# Patient Record
Sex: Female | Born: 1985 | Race: White | Hispanic: No | Marital: Married | Smoking: Never smoker
Health system: Southern US, Community
[De-identification: ages and names within clinical notes are randomized; demographics above are authoritative.]

## PROBLEM LIST (undated history)

## (undated) DIAGNOSIS — F419 Anxiety disorder, unspecified: Secondary | ICD-10-CM

## (undated) DIAGNOSIS — G43909 Migraine, unspecified, not intractable, without status migrainosus: Secondary | ICD-10-CM

## (undated) DIAGNOSIS — IMO0002 Reserved for concepts with insufficient information to code with codable children: Secondary | ICD-10-CM

## (undated) HISTORY — DX: Anxiety disorder, unspecified: F41.9

## (undated) HISTORY — DX: Reserved for concepts with insufficient information to code with codable children: IMO0002

## (undated) HISTORY — DX: Migraine, unspecified, not intractable, without status migrainosus: G43.909

---

## 2008-11-10 HISTORY — PX: DILATION AND CURETTAGE OF UTERUS: SHX78

## 2018-08-25 ENCOUNTER — Encounter: Payer: Self-pay | Admitting: Medical

## 2018-08-25 ENCOUNTER — Ambulatory Visit (INDEPENDENT_AMBULATORY_CARE_PROVIDER_SITE_OTHER): Payer: 59 | Admitting: Medical

## 2018-08-25 VITALS — BP 112/83 | HR 63 | Temp 98.5°F | Resp 16 | Ht 66.0 in | Wt 202.0 lb

## 2018-08-25 DIAGNOSIS — F439 Reaction to severe stress, unspecified: Secondary | ICD-10-CM

## 2018-08-25 DIAGNOSIS — G4700 Insomnia, unspecified: Secondary | ICD-10-CM | POA: Diagnosis not present

## 2018-08-25 DIAGNOSIS — E669 Obesity, unspecified: Secondary | ICD-10-CM

## 2018-08-25 DIAGNOSIS — F419 Anxiety disorder, unspecified: Secondary | ICD-10-CM

## 2018-08-25 DIAGNOSIS — N62 Hypertrophy of breast: Secondary | ICD-10-CM | POA: Diagnosis not present

## 2018-08-25 MED ORDER — TRAZODONE HCL 50 MG PO TABS: 25.0000 mg | ORAL_TABLET | Freq: Every evening | ORAL | 0 refills | Status: DC | PRN

## 2018-08-25 MED ORDER — PHENTERMINE HCL 15 MG PO CAPS: 15.0000 mg | ORAL_CAPSULE | ORAL | 0 refills | Status: DC

## 2018-08-25 NOTE — Patient Instructions (Signed)
For your history of obesity, large breast and desire for breast reduction, I am prescribing you phentermine to take daily.  Continue with diet and exercise.  Hopefully you will be able to lose a 10% body weight that your insurance requires.  Potential side effects discussed today.  Advised to avoid caffeine use.  For history of insomnia, I am prescribing trazodone.  Recommend that you start to use this for couple of days before you use the phentermine.  For stress and anxiety, you might benefit from trazodone.  If anxiety worsens please let me know and might consider other medications.  Recommend follow-up in 1 month or as needed.  You could go ahead and schedule a complete physical exam and come in fasting that morning.
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
## 2018-09-10 HISTORY — PX: REDUCTION MAMMAPLASTY: SUR839

## 2018-09-16 ENCOUNTER — Other Ambulatory Visit: Payer: Self-pay | Admitting: Medical

## 2018-09-28 ENCOUNTER — Encounter: Payer: Managed Care, Other (non HMO) | Admitting: Medical

## 2019-05-06 ENCOUNTER — Other Ambulatory Visit: Payer: Self-pay | Admitting: Obstetrics and Gynecology

## 2019-05-06 DIAGNOSIS — N63 Unspecified lump in unspecified breast: Secondary | ICD-10-CM

## 2019-05-12 ENCOUNTER — Ambulatory Visit
Admission: RE | Admit: 2019-05-12 | Discharge: 2019-05-12 | Disposition: A | Discharge status: Home / Self Care | Payer: 59 | Source: Ambulatory Visit | Attending: Obstetrics and Gynecology | Admitting: Obstetrics and Gynecology

## 2019-05-12 ENCOUNTER — Other Ambulatory Visit: Payer: Self-pay

## 2019-05-12 DIAGNOSIS — N63 Unspecified lump in unspecified breast: Secondary | ICD-10-CM

## 2019-11-24 ENCOUNTER — Ambulatory Visit (INDEPENDENT_AMBULATORY_CARE_PROVIDER_SITE_OTHER): Payer: Managed Care, Other (non HMO) | Admitting: Medical

## 2019-11-24 ENCOUNTER — Other Ambulatory Visit (HOSPITAL_COMMUNITY)
Admission: RE | Admit: 2019-11-24 | Discharge: 2019-11-24 | Disposition: A | Discharge status: Home / Self Care | Payer: Managed Care, Other (non HMO) | Source: Ambulatory Visit | Attending: Medical | Admitting: Medical

## 2019-11-24 ENCOUNTER — Encounter: Payer: Self-pay | Admitting: Medical

## 2019-11-24 ENCOUNTER — Other Ambulatory Visit: Payer: Self-pay

## 2019-11-24 VITALS — BP 122/88 | HR 76 | Temp 96.9°F | Resp 18 | Ht 66.0 in | Wt 204.2 lb

## 2019-11-24 DIAGNOSIS — Z0000 Encounter for general adult medical examination without abnormal findings: Secondary | ICD-10-CM

## 2019-11-24 DIAGNOSIS — R8290 Unspecified abnormal findings in urine: Secondary | ICD-10-CM | POA: Diagnosis not present

## 2019-11-24 DIAGNOSIS — E669 Obesity, unspecified: Secondary | ICD-10-CM | POA: Diagnosis not present

## 2019-11-24 LAB — LIPID PANEL
Cholesterol: 212 mg/dL — ABNORMAL HIGH (ref 0–200)
HDL: 63.2 mg/dL (ref >39.00)
LDL Cholesterol: 122 mg/dL — ABNORMAL HIGH (ref 0–99)
NonHDL: 148.84
Total CHOL/HDL Ratio: 3
Triglycerides: 133 mg/dL (ref 0.0–149.0)
VLDL: 26.6 mg/dL (ref 0.0–40.0)

## 2019-11-24 LAB — COMPREHENSIVE METABOLIC PANEL
ALT: 10 U/L (ref 0–35)
AST: 15 U/L (ref 0–37)
Albumin: 4.3 g/dL (ref 3.5–5.2)
Alkaline Phosphatase: 79 U/L (ref 39–117)
BUN: 14 mg/dL (ref 6–23)
CO2: 27 mEq/L (ref 19–32)
Calcium: 9.2 mg/dL (ref 8.4–10.5)
Chloride: 104 mEq/L (ref 96–112)
Creatinine, Ser: 0.79 mg/dL (ref 0.40–1.20)
GFR: 82.62 mL/min (ref >60.00)
Glucose, Bld: 90 mg/dL (ref 70–99)
Potassium: 4.1 mEq/L (ref 3.5–5.1)
Sodium: 138 mEq/L (ref 135–145)
Total Bilirubin: 0.4 mg/dL (ref 0.2–1.2)
Total Protein: 7.6 g/dL (ref 6.0–8.3)

## 2019-11-24 LAB — POC URINALSYSI DIPSTICK (AUTOMATED)
Bilirubin, UA: NEGATIVE
Glucose, UA: NEGATIVE
Ketones, UA: NEGATIVE
Nitrite, UA: NEGATIVE
Protein, UA: NEGATIVE
Spec Grav, UA: >=1.03 — AB (ref 1.010–1.025)
Urobilinogen, UA: 0.2 E.U./dL
pH, UA: 5.5 (ref 5.0–8.0)

## 2019-11-24 LAB — CBC WITH DIFFERENTIAL/PLATELET
Basophils Absolute: 0.1 10*3/uL (ref 0.0–0.1)
Basophils Relative: 0.7 % (ref 0.0–3.0)
Eosinophils Absolute: 0 10*3/uL (ref 0.0–0.7)
Eosinophils Relative: 0.6 % (ref 0.0–5.0)
HCT: 39 % (ref 36.0–46.0)
Hemoglobin: 13 g/dL (ref 12.0–15.0)
Lymphocytes Relative: 33 % (ref 12.0–46.0)
Lymphs Abs: 2.7 10*3/uL (ref 0.7–4.0)
MCHC: 33.2 g/dL (ref 30.0–36.0)
MCV: 86.2 fl (ref 78.0–100.0)
Monocytes Absolute: 0.5 10*3/uL (ref 0.1–1.0)
Monocytes Relative: 6.7 % (ref 3.0–12.0)
Neutro Abs: 4.8 10*3/uL (ref 1.4–7.7)
Neutrophils Relative %: 59 % (ref 43.0–77.0)
Platelets: 254 10*3/uL (ref 150.0–400.0)
RBC: 4.53 Mil/uL (ref 3.87–5.11)
RDW: 12.4 % (ref 11.5–15.5)
WBC: 8.1 10*3/uL (ref 4.0–10.5)

## 2019-11-24 MED ORDER — CIPROFLOXACIN HCL 250 MG PO TABS: 250.0000 mg | ORAL_TABLET | Freq: Two times a day (BID) | ORAL | 0 refills | Status: DC

## 2019-11-24 NOTE — Progress Notes (Signed)
Subjective:    Patient ID: Tina Stephens, female    DOB: [birth year], 34 y.o.   MRN: [id number]  HPI  She states she feel well today.  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 Has 2 children 38 yo and 1 yo. Rare caffeine beverage. She is married.    Pt states has bad odor to her urine for over a week.   Pt states wants to loose weight . In past she was up to 315 lb 16  years ago. In past got down to 165 lb. Pt orders every plate on line. Expresses frustration about efforts. WW in past did help but seems to indicate would not want to do that now. She is doubtful that insurance will cover weight loss MD evaluation but willing to investigate. Was going to go more in depth regarding her desired method but she expressed needed to leave/get thru with appointment in expidited manner. I was running behind by about 35 minutes today.   Nov 02, 2019.  Papsmear- recently done. Results pending.   Review of Systems  Constitutional: Negative for chills, fatigue and fever.  Respiratory: Negative for cough, chest tightness, shortness of breath and wheezing.   Cardiovascular: Negative for chest pain and palpitations.  Gastrointestinal: Negative for abdominal pain.  Genitourinary: Negative for difficulty urinating, enuresis, frequency, pelvic pain and urgency.       Odor to urine.  Musculoskeletal: Negative for back pain, neck pain and neck stiffness.  Skin: Negative for rash.  Neurological: Negative for dizziness, syncope, weakness, numbness and headaches.  Hematological: Negative for adenopathy. Does not bruise/bleed easily.  Psychiatric/Behavioral: Negative for behavioral problems, confusion and sleep disturbance. The patient is not nervous/anxious.     Past Medical History:  Diagnosis Date  . Anxiety   . Migraine      Social History   Socioeconomic History  . Marital status: Married    Spouse name: Not on file  . Number of children: Not on file  . Years  of education: Not on file  . Highest education level: Not on file  Occupational History  . Not on file  Tobacco Use  . Smoking status: Never Smoker  . Smokeless tobacco: Never Used  Substance and Sexual Activity  . Alcohol use: Never  . Drug use: Never  . Sexual activity: Yes  Other Topics Concern  . Not on file  Social History Narrative  . Not on file   Social Determinants of Health   Financial Resource Strain:   . Difficulty of Paying Living Expenses: Not on file  Food Insecurity:   . Worried About Charity fundraiser in the Last Year: Not on file  . Ran Out of Food in the Last Year: Not on file  Transportation Needs:   . Lack of Transportation (Medical): Not on file  . Lack of Transportation (Non-Medical): Not on file  Physical Activity:   . Days of Exercise per Week: Not on file  . Minutes of Exercise per Session: Not on file  Stress:   . Feeling of Stress : Not on file  Social Connections:   . Frequency of Communication with Friends and Family: Not on file  . Frequency of Social Gatherings with Friends and Family: Not on file  . Attends Religious Services: Not on file  . Active Member of Clubs or Organizations: Not on file  . Attends Archivist Meetings: Not on file  . Marital Status: Not on file  Intimate Partner Violence:   . Fear of Current or Ex-Partner: Not on file  . Emotionally Abused: Not on file  . Physically Abused: Not on file  . Sexually Abused: Not on file      Family History  Problem Relation Age of Onset  . Hypertension Mother     No Known Allergies  Current Outpatient Medications on File Prior to Visit  Medication Sig Dispense Refill  . linaclotide (LINZESS) 145 MCG CAPS capsule Take by mouth.    . Norgestimate-Ethinyl Estradiol Triphasic 0.18/0.215/0.25 MG-25 MCG tab Take by mouth.     No current facility-administered medications on file prior to visit.    BP 122/88 (BP Location: Right Arm, Patient Position: Sitting, Cuff  Size: Normal)   Pulse 76   Temp (!) 96.9 F (36.1 C) (Temporal)   Resp 18   Ht 5\' 6"  (1.676 m)   Wt 204 lb 3.2 oz (92.6 kg)   SpO2 98%   BMI 32.96 kg/m       Objective:   Physical Exam  General Mental Status- Alert. General Appearance- Not in acute distress.   Skin General: Color- Normal Color. Moisture- Normal Moisture.  Neck Carotid Arteries- Normal color. Moisture- Normal Moisture. No carotid bruits. No JVD.  Chest and Lung Exam Auscultation: Breath Sounds:-Normal.  Cardiovascular Auscultation:Rythm- Regular. Murmurs & Other Heart Sounds:Auscultation of the heart reveals- No Murmurs.  Abdomen Inspection:-Inspeection Normal. Palpation/Percussion:Note:No mass. Palpation and Percussion of the abdomen reveal- Non Tender, Non Distended + BS, no rebound or guarding.    Neurologic Cranial Nerve exam:- CN III-XII intact(No nystagmus), symmetric smile. Strength:- 5/5 equal and symmetric strength both upper and lower extremities.      Assessment & Plan:  For you wellness exam today I have ordered cbc, cmp, lipid panel, ua and urine culture.  Cipro 250 mg twice daily x 3 days. Possible uti. Will also get urine ancillary bv.  Recommend exercise and healthy diet.  We will let you know lab results as they come in.  Follow up date appointment will be determined after lab review.   Referral to weight management placed.(counseled on wt loss)  10-15  minutes spent counseling on plan for urine odor/possible uti and wt loss counseling. Referral to wt loss management placed.  , PA-C

## 2019-11-24 NOTE — Patient Instructions (Addendum)
For you wellness exam today I have ordered cbc, cmp, lipid panel, ua and urine culture.  Cipro 250 mg twice daily x 3 days. Possible uti. Will also get urine ancillary bv.  Recommend exercise and healthy diet.  We will let you know lab results as they come in.  Follow up date appointment will be determined after lab review.   Referral to weight management placed.(counseled on wt loss)   Preventive Care 54-36 Years Old, Female Preventive care refers to visits with your health care provider and lifestyle choices that can promote health and wellness. This includes:  A yearly physical exam. This may also be called an annual well check.  Regular dental visits and eye exams.  Immunizations.  Screening for certain conditions.  Healthy lifestyle choices, such as eating a healthy diet, getting regular exercise, not using drugs or products that contain nicotine and tobacco, and limiting alcohol use. What can I expect for my preventive care visit? Physical exam Your health care provider will check your:  Height and weight. This may be used to calculate body mass index (BMI), which tells if you are at a healthy weight.  Heart rate and blood pressure.  Skin for abnormal spots. Counseling Your health care provider may ask you questions about your:  Alcohol, tobacco, and drug use.  Emotional well-being.  Home and relationship well-being.  Sexual activity.  Eating habits.  Work and work Statistician.  Method of birth control.  Menstrual cycle.  Pregnancy history. What immunizations do I need?  Influenza (flu) vaccine  This is recommended every year. Tetanus, diphtheria, and pertussis (Tdap) vaccine  You may need a Td booster every 10 years. Varicella (chickenpox) vaccine  You may need this if you have not been vaccinated. Human papillomavirus (HPV) vaccine  If recommended by your health care provider, you may need three doses over 6 months. Measles, mumps, and  rubella (MMR) vaccine  You may need at least one dose of MMR. You may also need a second dose. Meningococcal conjugate (MenACWY) vaccine  One dose is recommended if you are age 2-21 years and a first-year college student living in a residence hall, or if you have one of several medical conditions. You may also need additional booster doses. Pneumococcal conjugate (PCV13) vaccine  You may need this if you have certain conditions and were not previously vaccinated. Pneumococcal polysaccharide (PPSV23) vaccine  You may need one or two doses if you smoke cigarettes or if you have certain conditions. Hepatitis A vaccine  You may need this if you have certain conditions or if you travel or work in places where you may be exposed to hepatitis A. Hepatitis B vaccine  You may need this if you have certain conditions or if you travel or work in places where you may be exposed to hepatitis B. Haemophilus influenzae type b (Hib) vaccine  You may need this if you have certain conditions. You may receive vaccines as individual doses or as more than one vaccine together in one shot (combination vaccines). Talk with your health care provider about the risks and benefits of combination vaccines. What tests do I need?  Blood tests  Lipid and cholesterol levels. These may be checked every 5 years starting at age 60.  Hepatitis C test.  Hepatitis B test. Screening  Diabetes screening. This is done by checking your blood sugar (glucose) after you have not eaten for a while (fasting).  Sexually transmitted disease (STD) testing.  BRCA-related cancer screening. This may be  done if you have a family history of breast, ovarian, tubal, or peritoneal cancers.  Pelvic exam and Pap test. This may be done every 3 years starting at age 45. Starting at age 35, this may be done every 5 years if you have a Pap test in combination with an HPV test. Talk with your health care provider about your test results,  treatment options, and if necessary, the need for more tests. Follow these instructions at home: Eating and drinking   Eat a diet that includes fresh fruits and vegetables, whole grains, lean protein, and low-fat dairy.  Take vitamin and mineral supplements as recommended by your health care provider.  Do not drink alcohol if: ? Your health care provider tells you not to drink. ? You are pregnant, may be pregnant, or are planning to become pregnant.  If you drink alcohol: ? Limit how much you have to 0-1 drink a day. ? Be aware of how much alcohol is in your drink. In the [place] one drink equals one 12 oz bottle of beer (355 mL), one 5 oz glass of wine (148 mL), or one 1 oz glass of hard liquor (44 mL). Lifestyle  Take daily care of your teeth and gums.  Stay active. Exercise for at least 30 minutes on 5 or more days each week.  Do not use any products that contain nicotine or tobacco, such as cigarettes, e-cigarettes, and chewing tobacco. If you need help quitting, ask your health care provider.  If you are sexually active, practice safe sex. Use a condom or other form of birth control (contraception) in order to prevent pregnancy and STIs (sexually transmitted infections). If you plan to become pregnant, see your health care provider for a preconception visit. What's next?  Visit your health care provider once a year for a well check visit.  Ask your health care provider how often you should have your eyes and teeth checked.  Stay up to date on all vaccines. This information is not intended to replace advice given to you by your health care provider. Make sure you discuss any questions you have with your health care provider. Document Revised: 07/08/2018 Document Reviewed: 07/08/2018 Elsevier Patient Education  2020 [employer].

## 2019-11-26 LAB — URINE CULTURE
MICRO NUMBER:: 10042290
SPECIMEN QUALITY:: ADEQUATE

## 2019-11-29 ENCOUNTER — Encounter: Payer: Self-pay | Admitting: Medical

## 2019-11-29 LAB — URINE CYTOLOGY ANCILLARY ONLY: Bacterial Vaginitis-Urine: NEGATIVE

## 2019-12-04 ENCOUNTER — Telehealth: Payer: Self-pay | Admitting: Medical

## 2019-12-04 MED ORDER — PHENTERMINE HCL 15 MG PO CAPS: 15.0000 mg | ORAL_CAPSULE | ORAL | 0 refills | Status: DC

## 2019-12-04 NOTE — Telephone Encounter (Signed)
I sent in prescription of phentermine to pt pharmacy. I believe was successful sending rx electronically. Will you follow up on that. Make sure it went thru.

## 2019-12-05 ENCOUNTER — Other Ambulatory Visit: Payer: Self-pay | Admitting: Medical

## 2019-12-05 NOTE — Telephone Encounter (Signed)
Called pharmacy Rx is at pharmacy ready for pickup. Left voicemail advising patient of Rx ready.

## 2019-12-06 ENCOUNTER — Other Ambulatory Visit: Payer: Self-pay | Admitting: Medical

## 2019-12-06 ENCOUNTER — Telehealth: Payer: Self-pay | Admitting: Medical

## 2019-12-06 NOTE — Telephone Encounter (Signed)
Take 1 tablet (250 mg total) by mouth 2 (two) times daily., Starting Thu 11/24/2019, Normal  Dispense: 6 tablet  Refills: 0 ordered     phentermine 15 MG capsule [[id number]]   Patient states that Rx wasn't sent to [facility] , or that she was unable to get Rx due to approval. She would like RX send   [facility] Dr Ginette Otto

## 2019-12-06 NOTE — Telephone Encounter (Signed)
Spoke to [facility] yesterday who informed medicine was ready for pick up yesterday. Notified patient today again.

## 2020-12-05 ENCOUNTER — Ambulatory Visit: Payer: Managed Care, Other (non HMO) | Admitting: Family Medicine

## 2020-12-11 LAB — HM PAP SMEAR: HM Pap smear: NORMAL
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
## 2021-08-12 ENCOUNTER — Encounter: Payer: Self-pay | Admitting: Medical

## 2021-08-29 ENCOUNTER — Ambulatory Visit (INDEPENDENT_AMBULATORY_CARE_PROVIDER_SITE_OTHER): Payer: Managed Care, Other (non HMO) | Admitting: Medical-Surgical

## 2021-08-29 ENCOUNTER — Encounter: Payer: Self-pay | Admitting: Medical-Surgical

## 2021-08-29 VITALS — BP 134/88 | HR 77 | Resp 20 | Ht 66.0 in | Wt 175.0 lb

## 2021-08-29 DIAGNOSIS — R8290 Unspecified abnormal findings in urine: Secondary | ICD-10-CM | POA: Diagnosis not present

## 2021-08-29 DIAGNOSIS — Z131 Encounter for screening for diabetes mellitus: Secondary | ICD-10-CM

## 2021-08-29 DIAGNOSIS — Z7689 Persons encountering health services in other specified circumstances: Secondary | ICD-10-CM

## 2021-08-29 DIAGNOSIS — Z23 Encounter for immunization: Secondary | ICD-10-CM

## 2021-08-29 DIAGNOSIS — Z0000 Encounter for general adult medical examination without abnormal findings: Secondary | ICD-10-CM

## 2021-08-29 DIAGNOSIS — F900 Attention-deficit hyperactivity disorder, predominantly inattentive type: Secondary | ICD-10-CM

## 2021-08-29 DIAGNOSIS — Z1329 Encounter for screening for other suspected endocrine disorder: Secondary | ICD-10-CM

## 2021-08-29 LAB — POCT URINALYSIS DIP (CLINITEK)
Bilirubin, UA: NEGATIVE
Glucose, UA: NEGATIVE mg/dL
Nitrite, UA: POSITIVE — AB
POC PROTEIN,UA: NEGATIVE
Spec Grav, UA: 1.025 (ref 1.010–1.025)
Urobilinogen, UA: 0.2 E.U./dL
pH, UA: 6 (ref 5.0–8.0)

## 2021-08-29 LAB — WET PREP FOR TRICH, YEAST, CLUE
MICRO NUMBER:: 12528918
Specimen Quality: ADEQUATE

## 2021-08-29 MED ORDER — AMPHETAMINE-DEXTROAMPHET ER 25 MG PO CP24: 25.0000 mg | ORAL_CAPSULE | ORAL | 0 refills | Status: DC

## 2021-08-29 MED ORDER — FLUCONAZOLE 150 MG PO TABS: 150.0000 mg | ORAL_TABLET | Freq: Once | ORAL | 0 refills | Status: AC

## 2021-08-29 MED ORDER — AMPHETAMINE-DEXTROAMPHET ER 25 MG PO CP24: 25.0000 mg | ORAL_CAPSULE | Freq: Every day | ORAL | 0 refills | Status: DC

## 2021-08-29 NOTE — Addendum Note (Signed)
Addended byChristen Butter on: 08/29/2021 12:17 PM   Modules accepted: Orders

## 2021-08-29 NOTE — Progress Notes (Signed)
New Patient Office Visit  Subjective:  Patient ID: Tina Stephens, female    DOB: September 30, 1986  Age: 35 y.o. MRN: [id number]  CC:  Chief Complaint  Patient presents with   Establish Care    HPI Jaydn Fincher presents to establish care.   Attention deficit- has been seeing a provider and a counselor with [facility]. Treated with Adderall XR 25mg  daily. Tolerates the medication well without side effects and feels that it helps her focus. Reports that [facility] is no longer able to prescribe controlled substances and advised her to either switch to Strattera or establish with a PCP for continuation of Adderall.   Had COVID in August of 2021. Since then her taste and smell have not returned to normal. Has also noted that her urine smells funny. Was evaluated by her OB/GYN and treated for both yeast and BV. The smell did not resolved. No other urinary symptoms. Notes that the smell does not change no matter how much water she drinks.   Past Medical History:  Diagnosis Date   Anxiety    Migraine    Ulcer 2004   diagnosed bleeding ulcer in 2004. No isses for a few years.    Past Surgical History:  Procedure Laterality Date   DILATION AND CURETTAGE OF UTERUS  2010   REDUCTION MAMMAPLASTY Bilateral 09/2018    Family History  Problem Relation Age of Onset   Hypertension Mother    Cancer Maternal Grandmother    Diabetes Paternal Grandmother    Cancer Paternal Aunt     Social History   Socioeconomic History   Marital status: Married    Spouse name: Not on file   Number of children: Not on file   Years of education: Not on file   Highest education level: Not on file  Occupational History   Not on file  Tobacco Use   Smoking status: Never   Smokeless tobacco: Never  Vaping Use   Vaping Use: Never used  Substance and Sexual Activity   Alcohol use: Never   Drug use: Never   Sexual activity: Yes    Birth control/protection: Pill  Other Topics Concern   Not on file  Social  History Narrative   Not on file   Social Determinants of Health   Financial Resource Strain: Not on file  Food Insecurity: Not on file  Transportation Needs: Not on file  Physical Activity: Not on file  Stress: Not on file  Social Connections: Not on file  Intimate Partner Violence: Not on file    ROS Review of Systems  Constitutional:  Negative for chills, fatigue, fever and unexpected weight change.  Respiratory:  Negative for cough, chest tightness, shortness of breath and wheezing.   Cardiovascular:  Negative for chest pain, palpitations and leg swelling.  Gastrointestinal:  Negative for abdominal pain, constipation, diarrhea, nausea and vomiting.  Endocrine: Positive for cold intolerance.  Genitourinary:        Abnormal urinary odor  Musculoskeletal:  Positive for back pain.  Neurological:  Positive for dizziness, light-headedness and numbness.  Hematological:  Bruises/bleeds easily.  Psychiatric/Behavioral:  Negative for dysphoric mood, self-injury, sleep disturbance and suicidal ideas. The patient is not nervous/anxious.    Objective:   Today's Vitals: BP 134/88 (BP Location: Left Arm, Patient Position: Sitting, Cuff Size: Normal)   Pulse 77   Resp 20   Ht 5\' 6"  (1.676 m)   Wt 175 lb (79.4 kg)   SpO2 99%   BMI 28.25 kg/m  Physical Exam Vitals reviewed.  Constitutional:      General: She is not in acute distress.    Appearance: Normal appearance. She is not ill-appearing.  HENT:     Head: Normocephalic and atraumatic.  Cardiovascular:     Rate and Rhythm: Normal rate and regular rhythm.     Pulses: Normal pulses.     Heart sounds: Normal heart sounds. No murmur heard.   No friction rub. No gallop.  Pulmonary:     Effort: Pulmonary effort is normal. No respiratory distress.     Breath sounds: Normal breath sounds. No wheezing.  Skin:    General: Skin is warm and dry.  Neurological:     Mental Status: She is alert and oriented to person, place, and time.   Psychiatric:        Mood and Affect: Mood normal.        Behavior: Behavior normal.        Thought Content: Thought content normal.        Judgment: Judgment normal.    Assessment & Plan:   1. Encounter to establish care Reviewed available information and discussed care concerns with patient.   2. Preventative health care Checking CBC with differential, CMP, and lipid panel today. - Lipid panel - COMPLETE METABOLIC PANEL WITH GFR - CBC with Differential/Platelet  3. Need for Tdap vaccination Tdap given in office today. - Tdap vaccine greater than or equal to 7yo IM  4. Thyroid disorder screen Checking TSH. - TSH  5. Diabetes mellitus screening Checking hemoglobin A1c. - Hemoglobin A1c  6. Abnormal urine odor POCT urinalysis today positive for nitrites, small leukocytes, and moderate blood. Sending for culture. Will wait on treatment until culture results since she is otherwise asymptomatic. Self swab wet prep to evaluate for clearance of yeast and BV that was previously treated by OB/GYN. - WET PREP FOR TRICH, YEAST, CLUE - POCT URINALYSIS DIP (CLINITEK)  7. Attention deficit hyperactivity disorder (ADHD), predominantly inattentive type Patient has records from [facility] and has email them to [place] for upload into our file.  Documented diagnosis of ADHD included.  Continue Adderall XR 25 mg daily.  75-month supply sent to pharmacy and advised patient regarding [facility] laws requiring follow-up and limitation of quantity of prescription.  Patient verbalized understanding. - amphetamine-dextroamphetamine (ADDERALL XR) 25 MG 24 hr capsule; Take 1 capsule by mouth every morning.  Dispense: 30 capsule; Refill: 0 - amphetamine-dextroamphetamine (ADDERALL XR) 25 MG 24 hr capsule; Take 1 capsule by mouth daily.  Dispense: 30 capsule; Refill: 0 - amphetamine-dextroamphetamine (ADDERALL XR) 25 MG 24 hr capsule; Take 1 capsule by mouth every morning.  Dispense: 30 capsule; Refill:  0   Outpatient Encounter Medications as of 08/29/2021  Medication Sig   amphetamine-dextroamphetamine (ADDERALL XR) 25 MG 24 hr capsule Take 1 capsule by mouth every morning.   [START ON 09/28/2021] amphetamine-dextroamphetamine (ADDERALL XR) 25 MG 24 hr capsule Take 1 capsule by mouth daily.   [START ON 10/28/2021] amphetamine-dextroamphetamine (ADDERALL XR) 25 MG 24 hr capsule Take 1 capsule by mouth every morning.   Norgestimate-Ethinyl Estradiol Triphasic 0.18/0.215/0.25 MG-25 MCG tab Take by mouth.   [DISCONTINUED] amphetamine-dextroamphetamine (ADDERALL XR) 25 MG 24 hr capsule Take 25 mg by mouth daily.   [DISCONTINUED] ciprofloxacin (CIPRO) 250 MG tablet TAKE 1 TABLET BY MOUTH TWICE A DAY   [DISCONTINUED] linaclotide (LINZESS) 145 MCG CAPS capsule Take by mouth.   [DISCONTINUED] phentermine 15 MG capsule Take 1 capsule (15 mg total) by mouth  every morning.   No facility-administered encounter medications on file as of 08/29/2021.    Follow-up: Return in about 3 months (around 11/29/2021) for ADHD follow up.   Thayer Ohm, DNP, APRN, FNP-BC [facility] and Sports Medicine

## 2021-08-30 LAB — COMPLETE METABOLIC PANEL WITH GFR
AG Ratio: 1.4 (calc) (ref 1.0–2.5)
ALT: 20 U/L (ref 6–29)
AST: 19 U/L (ref 10–30)
Albumin: 4.4 g/dL (ref 3.6–5.1)
Alkaline phosphatase (APISO): 92 U/L (ref 31–125)
BUN: 16 mg/dL (ref 7–25)
CO2: 25 mmol/L (ref 20–32)
Calcium: 9.5 mg/dL (ref 8.6–10.2)
Chloride: 101 mmol/L (ref 98–110)
Creat: 0.85 mg/dL (ref 0.50–0.97)
Globulin: 3.1 g/dL (calc) (ref 1.9–3.7)
Glucose, Bld: 88 mg/dL (ref 65–99)
Potassium: 4.3 mmol/L (ref 3.5–5.3)
Sodium: 138 mmol/L (ref 135–146)
Total Bilirubin: 0.6 mg/dL (ref 0.2–1.2)
Total Protein: 7.5 g/dL (ref 6.1–8.1)
eGFR: 90 mL/min/{1.73_m2} (ref >=60)

## 2021-08-30 LAB — CBC WITH DIFFERENTIAL/PLATELET
Absolute Monocytes: 490 cells/uL (ref 200–950)
Basophils Absolute: 49 cells/uL (ref 0–200)
Basophils Relative: 0.7 %
Eosinophils Absolute: 49 cells/uL (ref 15–500)
Eosinophils Relative: 0.7 %
HCT: 38.9 % (ref 35.0–45.0)
Hemoglobin: 13.1 g/dL (ref 11.7–15.5)
Lymphs Abs: 1939 cells/uL (ref 850–3900)
MCH: 29 pg (ref 27.0–33.0)
MCHC: 33.7 g/dL (ref 32.0–36.0)
MCV: 86.3 fL (ref 80.0–100.0)
MPV: 10.7 fL (ref 7.5–12.5)
Monocytes Relative: 7 %
Neutro Abs: 4473 cells/uL (ref 1500–7800)
Neutrophils Relative %: 63.9 %
Platelets: 248 10*3/uL (ref 140–400)
RBC: 4.51 10*6/uL (ref 3.80–5.10)
RDW: 11.6 % (ref 11.0–15.0)
Total Lymphocyte: 27.7 %
WBC: 7 10*3/uL (ref 3.8–10.8)

## 2021-08-30 LAB — LIPID PANEL
Cholesterol: 214 mg/dL — ABNORMAL HIGH (ref <200)
HDL: 74 mg/dL (ref >=50)
LDL Cholesterol (Calc): 119 mg/dL (calc) — ABNORMAL HIGH
Non-HDL Cholesterol (Calc): 140 mg/dL (calc) — ABNORMAL HIGH (ref <130)
Total CHOL/HDL Ratio: 2.9 (calc) (ref <5.0)
Triglycerides: 100 mg/dL (ref <150)

## 2021-08-30 LAB — HEMOGLOBIN A1C
Hgb A1c MFr Bld: 5.1 % of total Hgb (ref <5.7)
Mean Plasma Glucose: 100 mg/dL
eAG (mmol/L): 5.5 mmol/L

## 2021-08-30 LAB — TSH: TSH: 1.92 mIU/L

## 2021-09-01 LAB — URINE CULTURE
MICRO NUMBER:: 12532188
SPECIMEN QUALITY:: ADEQUATE

## 2021-09-02 MED ORDER — NITROFURANTOIN MONOHYD MACRO 100 MG PO CAPS: 100.0000 mg | ORAL_CAPSULE | Freq: Two times a day (BID) | ORAL | 0 refills | Status: DC

## 2021-09-02 NOTE — Addendum Note (Signed)
Addended byChristen Butter on: 09/02/2021 07:19 AM   Modules accepted: Orders

## 2021-10-02 ENCOUNTER — Ambulatory Visit (INDEPENDENT_AMBULATORY_CARE_PROVIDER_SITE_OTHER): Payer: Managed Care, Other (non HMO) | Admitting: Medical-Surgical

## 2021-10-02 ENCOUNTER — Encounter: Payer: Self-pay | Admitting: Medical-Surgical

## 2021-10-02 ENCOUNTER — Other Ambulatory Visit: Payer: Self-pay

## 2021-10-02 DIAGNOSIS — R8290 Unspecified abnormal findings in urine: Secondary | ICD-10-CM | POA: Diagnosis not present

## 2021-10-02 LAB — WET PREP FOR TRICH, YEAST, CLUE
MICRO NUMBER:: 12675462
Specimen Quality: ADEQUATE

## 2021-10-02 LAB — POCT URINALYSIS DIPSTICK
Bilirubin, UA: NEGATIVE
Glucose, UA: NEGATIVE
Ketones, UA: NEGATIVE
Leukocytes, UA: NEGATIVE
Nitrite, UA: NEGATIVE
Protein, UA: NEGATIVE
Spec Grav, UA: >=1.03 — AB (ref 1.010–1.025)
Urobilinogen, UA: 0.2 E.U./dL
pH, UA: 5.5 (ref 5.0–8.0)

## 2021-10-02 NOTE — Telephone Encounter (Signed)
Patient will need to do another urine and wet prep self swab. Can we get her in for a nurse visit or work her in somewhere? If not, she can come in early next week to redo the samples.

## 2021-10-02 NOTE — Telephone Encounter (Signed)
Pt scheduled for 2pm today for nurse visit.  Tiajuana Amass, CMA

## 2021-10-02 NOTE — Progress Notes (Signed)
Pt here for self swab wet prep and UA per Christen Butter.  Tiajuana Amass, CMA

## 2021-10-04 MED ORDER — FLUCONAZOLE 150 MG PO TABS: 150.0000 mg | ORAL_TABLET | Freq: Once | ORAL | 0 refills | Status: AC

## 2021-10-04 NOTE — Addendum Note (Signed)
Addended byChristen Butter on: 10/04/2021 02:04 PM   Modules accepted: Orders

## 2021-10-14 ENCOUNTER — Encounter: Payer: Self-pay | Admitting: Medical-Surgical

## 2021-11-28 ENCOUNTER — Ambulatory Visit: Payer: Managed Care, Other (non HMO) | Admitting: Medical-Surgical

## 2021-12-09 ENCOUNTER — Ambulatory Visit (INDEPENDENT_AMBULATORY_CARE_PROVIDER_SITE_OTHER): Payer: Managed Care, Other (non HMO) | Admitting: Medical-Surgical

## 2021-12-09 ENCOUNTER — Other Ambulatory Visit: Payer: Self-pay

## 2021-12-09 ENCOUNTER — Encounter: Payer: Self-pay | Admitting: Medical-Surgical

## 2021-12-09 VITALS — BP 124/75 | HR 77 | Resp 20 | Ht 66.0 in | Wt 170.0 lb

## 2021-12-09 DIAGNOSIS — F900 Attention-deficit hyperactivity disorder, predominantly inattentive type: Secondary | ICD-10-CM

## 2021-12-09 MED ORDER — AMPHETAMINE-DEXTROAMPHET ER 25 MG PO CP24: 25.0000 mg | ORAL_CAPSULE | Freq: Every day | ORAL | 0 refills | Status: DC

## 2021-12-09 MED ORDER — AMPHETAMINE-DEXTROAMPHET ER 25 MG PO CP24: 25.0000 mg | ORAL_CAPSULE | ORAL | 0 refills | Status: DC

## 2021-12-09 NOTE — Progress Notes (Signed)
°  HPI with pertinent ROS:   CC: ADHD follow-up  HPI: Pleasant 36 year old female presenting today for follow-up on ADHD.  She has been using Adderall XR 25 mg daily, tolerating well without side effects.  Does note that there are some days that she forgets to take her medicine but she takes it most days of the week.  No difficulty with sleep changes.  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  Feels the medication is working well for her and helps to keep her focus.  Does still have some days that are extremely busy where she feels like she is having trouble focusing but feels this may be related to the fact that she puts too much on her plate at 1 time.  She is learning how to say no and redistribute the responsibility as needed.  I reviewed the past medical history, family history, social history, surgical history, and allergies today and no changes were needed.  Please see the problem list section below in epic for further details.   Physical exam:   General: Well Developed, well nourished, and in no acute distress.  Neuro: Alert and oriented x3. HEENT: Normocephalic, atraumatic. Skin: Warm and dry. Cardiac: Regular rate and rhythm. Respiratory:.  Not using accessory muscles, speaking in full sentences.  Impression and Recommendations:    1. Attention deficit hyperactivity disorder (ADHD), predominantly inattentive type Symptoms well controlled on current regimen.  Continue Adderall XR 25 mg daily.  Refills sent to pharmacy.  Return in about 3 months (around 03/09/2022) for ADHD follow up. ___________________________________________ Clearnce Sorrel, DNP, APRN, FNP-BC Primary Care and [place]

## 2022-02-23 NOTE — Progress Notes (Signed)
?  HPI with pertinent ROS:  ? ?CC: ADHD follow up ? ?HPI: ?Pleasant 36 year old female presenting today for follow up on ADHD. Has been taking Adderall XR 25mg  daily as prescribed, tolerating well without side effects. Feels the medication is working well. Does not notice a difference when she does take the medication but definitely notices when she doesn't take it. Having some issues with scattered thoughts and overwhelmed but those days are far and few between. No palpitations. Decreased appetite and has lost some weight. Maintaining her weight for the last 4-5 months. Does skip some doses, usually on weekends.  ? ?Notes that she has had a bit of upper respiratory symptoms and has been taking cold medicine and decongestants for the last few days.  Wonders if this may be why her blood pressure is elevated today. ? ?I reviewed the past medical history, family history, social history, surgical history, and allergies today and no changes were needed.  Please see the problem list section below in epic for further details. ? ?Physical exam:  ? ?General: Well Developed, well nourished, and in no acute distress.  ?Neuro: Alert and oriented x3.  ?HEENT: Normocephalic, atraumatic.  ?Skin: Warm and dry. ?Cardiac: Regular rate and rhythm, no murmurs rubs or gallops, no lower extremity edema.  ?Respiratory: Clear to auscultation bilaterally. Not using accessory muscles, speaking in full sentences. ? ?Impression and Recommendations:   ? ?1. Attention deficit hyperactivity disorder (ADHD), predominantly inattentive type ?Doing well on Adderall XR 25 mg daily.  We will plan to continue this however we do need to make sure her blood pressure has returned to normal.  Advised to check blood pressures at home daily for the next 7 days.  I will send a message via MyChart in 1 week to see how her blood pressures are running.  If doing well, we will continue her Adderall at the current dose.  May benefit from avoiding decongestants over  the next week to make sure that her blood pressures are accurate. ? ?Return in about 3 months (around 05/26/2022) for ADHD follow up. ?___________________________________________ ?05/28/2022, DNP, APRN, FNP-BC ?Primary Care and Sports Medicine ?[facility] Thayer Ohm ?

## 2022-02-24 ENCOUNTER — Ambulatory Visit (INDEPENDENT_AMBULATORY_CARE_PROVIDER_SITE_OTHER): Payer: Managed Care, Other (non HMO) | Admitting: Medical-Surgical

## 2022-02-24 ENCOUNTER — Encounter: Payer: Self-pay | Admitting: Medical-Surgical

## 2022-02-24 DIAGNOSIS — F900 Attention-deficit hyperactivity disorder, predominantly inattentive type: Secondary | ICD-10-CM | POA: Diagnosis not present

## 2022-04-08 ENCOUNTER — Other Ambulatory Visit: Payer: Self-pay | Admitting: Medical-Surgical

## 2022-04-08 DIAGNOSIS — F900 Attention-deficit hyperactivity disorder, predominantly inattentive type: Secondary | ICD-10-CM

## 2022-04-08 MED ORDER — AMPHETAMINE-DEXTROAMPHET ER 25 MG PO CP24: 25.0000 mg | ORAL_CAPSULE | Freq: Every day | ORAL | 0 refills | Status: DC

## 2022-04-08 MED ORDER — AMPHETAMINE-DEXTROAMPHET ER 25 MG PO CP24: 25.0000 mg | ORAL_CAPSULE | ORAL | 0 refills | Status: DC

## 2022-04-14 ENCOUNTER — Encounter: Payer: Self-pay | Admitting: Medical-Surgical

## 2022-05-20 ENCOUNTER — Encounter: Payer: Self-pay | Admitting: Medical-Surgical

## 2022-05-26 NOTE — Progress Notes (Unsigned)
   Established Patient Office Visit  Subjective   Patient ID: Tina Stephens, female   DOB: 04/25/1986 Age: 36 y.o. MRN: [id number]   No chief complaint on file.   HPI Pleasant 36 year old female presenting today for follow up on ADHD. Taking Adderall XR  25mg  daily  but has not been taking it regularly over the past several months. Does not notice much difference when she takes it vs when she doesn't. When she first started the medication she was under tons of job stress and working 90 hours per week. Now her job situation has changed and she is working for herself so she has been less stressed. Also notes that the appetite suppression with Adderall has started to wane. Finds herself snacking more often and never feels full.    Objective:    Vitals:   05/27/22 0910  BP: 124/78  Pulse: 74  Resp: 20  Height: 5\' 6"  (1.676 m)  Weight: 171 lb 11.2 oz (77.9 kg)  SpO2: 100%  BMI (Calculated): 27.73    Physical Exam Vitals reviewed.  Constitutional:      General: She is not in acute distress.    Appearance: Normal appearance. She is not ill-appearing.  HENT:     Head: Normocephalic and atraumatic.  Cardiovascular:     Rate and Rhythm: Normal rate and regular rhythm.  Pulmonary:     Effort: Pulmonary effort is normal. No respiratory distress.  Skin:    General: Skin is warm and dry.  Neurological:     Mental Status: She is alert and oriented to person, place, and time.  Psychiatric:        Mood and Affect: Mood normal.        Behavior: Behavior normal.        Thought Content: Thought content normal.        Judgment: Judgment normal.   No results found for this or any previous visit (from the past 24 hour(s)).     The ASCVD Risk score (Arnett DK, et al., 2019) failed to calculate for the following reasons:   The 2019 ASCVD risk score is only valid for ages 32 to 19   Assessment & Plan:   Attention deficit hyperactivity disorder (ADHD), predominantly inattentive type With  recent poor response to Adderall XR 25 mg daily, we will make a bit of a change.  Increasing to Adderall XR 30 mg once daily.  We will try this for a month and if no improvement in her appetite and ADHD symptoms, we will consider switching to Vyvanse instead.  Patient verbalized understanding is agreeable to the plan.  Advised to continue monitoring blood pressure at home since she has had some that were a bit elevated but today looks good.  I will send her a message via MyChart in a few weeks to see how she is doing on the new dose of medication and if we need to make a change.  Return in about 3 months (around 08/27/2022) for ADHD follow up.  ___________________________________________ 74, DNP, APRN, FNP-BC Primary Care and Sports Medicine [facility]

## 2022-05-27 ENCOUNTER — Encounter: Payer: Self-pay | Admitting: Medical-Surgical

## 2022-05-27 ENCOUNTER — Ambulatory Visit (INDEPENDENT_AMBULATORY_CARE_PROVIDER_SITE_OTHER): Payer: Managed Care, Other (non HMO) | Admitting: Medical-Surgical

## 2022-05-27 VITALS — BP 124/78 | HR 74 | Resp 20 | Ht 66.0 in | Wt 171.7 lb

## 2022-05-27 DIAGNOSIS — F900 Attention-deficit hyperactivity disorder, predominantly inattentive type: Secondary | ICD-10-CM

## 2022-05-27 MED ORDER — AMPHETAMINE-DEXTROAMPHET ER 30 MG PO CP24: 30.0000 mg | ORAL_CAPSULE | ORAL | 0 refills | Status: DC

## 2022-05-27 NOTE — Assessment & Plan Note (Signed)
With recent poor response to Adderall XR 25 mg daily, we will make a bit of a change.  Increasing to Adderall XR 30 mg once daily.  We will try this for a month and if no improvement in her appetite and ADHD symptoms, we will consider switching to Vyvanse instead.  Patient verbalized understanding is agreeable to the plan.  Advised to continue monitoring blood pressure at home since she has had some that were a bit elevated but today looks good.  I will send her a message via MyChart in a few weeks to see how she is doing on the new dose of medication and if we need to make a change.

## 2022-06-25 MED ORDER — LISDEXAMFETAMINE DIMESYLATE 30 MG PO CAPS: 30.0000 mg | ORAL_CAPSULE | Freq: Every day | ORAL | 0 refills | Status: DC

## 2022-06-25 NOTE — Addendum Note (Signed)
Addended byChristen Butter on: 06/25/2022 12:28 PM   Modules accepted: Orders

## 2022-07-04 NOTE — Telephone Encounter (Signed)
Patient called wanting to know if she can go back to the Adderall since her insurance is wanting a PA for the new medication.

## 2022-07-07 MED ORDER — AMPHETAMINE-DEXTROAMPHET ER 30 MG PO CP24: 30.0000 mg | ORAL_CAPSULE | ORAL | 0 refills | Status: DC

## 2022-07-07 MED ORDER — LISDEXAMFETAMINE DIMESYLATE 30 MG PO CAPS
30.0000 mg | ORAL_CAPSULE | Freq: Every day | ORAL | 0 refills | Status: DC
Start: 2022-07-07 — End: 2022-07-07

## 2022-07-07 NOTE — Addendum Note (Signed)
Addended by: Mammie Lorenzo on: 07/07/2022 05:01 PM   Modules accepted: Orders

## 2022-07-07 NOTE — Addendum Note (Signed)
Addended by: Mammie Lorenzo on: 07/07/2022 04:59 PM   Modules accepted: Orders

## 2022-08-24 NOTE — Progress Notes (Unsigned)
   Established Patient Office Visit  Subjective   Patient ID: Tina Stephens, female   DOB: 06/28/1986 Age: 36 y.o. MRN: [id number]   No chief complaint on file.   HPI Pleasant 36 year old female presenting today for follow up on ADHD.    Objective:    There were no vitals filed for this visit.  Physical Exam   No results found for this or any previous visit (from the past 24 hour(s)).   {Labs (Optional):23779}  The ASCVD Risk score (Arnett DK, et al., 2019) failed to calculate for the following reasons:   The 2019 ASCVD risk score is only valid for ages 33 to 28   Assessment & Plan:   No problem-specific Assessment & Plan notes found for this encounter.   No follow-ups on file.  ___________________________________________ Clearnce Sorrel, DNP, APRN, FNP-BC Primary Care and [place]

## 2022-08-25 ENCOUNTER — Ambulatory Visit (INDEPENDENT_AMBULATORY_CARE_PROVIDER_SITE_OTHER): Payer: Managed Care, Other (non HMO) | Admitting: Medical-Surgical

## 2022-08-25 ENCOUNTER — Encounter: Payer: Self-pay | Admitting: Medical-Surgical

## 2022-08-25 VITALS — BP 125/81 | HR 76 | Resp 20 | Ht 66.0 in | Wt 178.0 lb

## 2022-08-25 DIAGNOSIS — F900 Attention-deficit hyperactivity disorder, predominantly inattentive type: Secondary | ICD-10-CM | POA: Diagnosis not present

## 2022-08-25 MED ORDER — AMPHETAMINE-DEXTROAMPHET ER 30 MG PO CP24: 30.0000 mg | ORAL_CAPSULE | ORAL | 0 refills | Status: DC

## 2022-11-07 ENCOUNTER — Ambulatory Visit (INDEPENDENT_AMBULATORY_CARE_PROVIDER_SITE_OTHER): Payer: Managed Care, Other (non HMO) | Admitting: Sports Medicine

## 2022-11-07 ENCOUNTER — Ambulatory Visit: Payer: Managed Care, Other (non HMO)

## 2022-11-07 VITALS — BP 134/85 | HR 78

## 2022-11-07 DIAGNOSIS — R221 Localized swelling, mass and lump, neck: Secondary | ICD-10-CM

## 2022-11-07 DIAGNOSIS — R590 Localized enlarged lymph nodes: Secondary | ICD-10-CM | POA: Insufficient documentation

## 2022-11-07 MED ORDER — AZITHROMYCIN 250 MG PO TABS: ORAL_TABLET | ORAL | 0 refills | Status: DC

## 2022-11-07 NOTE — Progress Notes (Signed)
    Procedures performed today:    None.  Independent interpretation of notes and tests performed by another provider:   None.  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 Since then she has noted it daily, it is still tender to palpation, well-defined and movable approximately 1 to 2 cm across and feels to be in the subcutaneous tissues. I do suspect this is supraclavicular lymphadenopathy. Sebaceous cyst and lipoma are also in the differential. Because this is right supraclavicular a Virchow's Node is considered less likely. Considering duration of potentially 3 weeks we will add a course of azithromycin, I would also like an ultrasound here I would like to see her back in about a month and see how things are going. If persistent in a month we will consider biopsy. She can see either me or her PCP at follow-up, but if still present I would certainly recommend repeat imaging, potential biopsy and advanced imaging with contrast of her neck and potentially mammogram.    ____________________________________________ Ihor Austin. Benjamin Stain, M.D., ABFM., CAQSM., AME. Primary Care and Sports Medicine [facility] [facility]  Adjunct Professor of Family Medicine  [place] [employer] [facility] [employer]  Restaurant manager, fast food

## 2022-11-07 NOTE — Assessment & Plan Note (Addendum)
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 Since then she has noted it daily, it is still tender to palpation, well-defined and movable approximately 1 to 2 cm across and feels to be in the subcutaneous tissues. I do suspect this is supraclavicular lymphadenopathy. Sebaceous cyst and lipoma are also in the differential. Because this is right supraclavicular a Virchow's Node is considered less likely. Considering duration of potentially 3 weeks we will add a course of azithromycin, I would also like an ultrasound here I would like to see her back in about a month and see how things are going. If persistent in a month we will consider biopsy. She can see either me or her PCP at follow-up, but if still present I would certainly recommend repeat imaging, potential biopsy and advanced imaging with contrast of her neck and potentially mammogram.

## 2022-11-26 ENCOUNTER — Ambulatory Visit: Payer: Managed Care, Other (non HMO) | Admitting: Medical-Surgical

## 2022-12-05 ENCOUNTER — Encounter: Payer: Self-pay | Admitting: Sports Medicine

## 2022-12-05 ENCOUNTER — Ambulatory Visit (INDEPENDENT_AMBULATORY_CARE_PROVIDER_SITE_OTHER): Payer: BC Managed Care – PPO | Admitting: Sports Medicine

## 2022-12-05 DIAGNOSIS — R590 Localized enlarged lymph nodes: Secondary | ICD-10-CM | POA: Diagnosis not present

## 2022-12-05 NOTE — Progress Notes (Signed)
    Procedures performed today:    None.  Independent interpretation of notes and tests performed by another provider:   None.  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 We added a course of azithromycin, she is doing a lot better now, lymphadenopathy is far improved, I would like repeat ultrasound for additional measurements and we will keep an eye on things for now. PCP can take over.    ____________________________________________ Gwen Her. Dianah Field, M.D., ABFM., CAQSM., AME. Primary Care and Sports Medicine  [facility]  Adjunct Professor of [place] [employer] [facility] of Medicine  Risk manager

## 2022-12-05 NOTE — Assessment & Plan Note (Signed)
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 We added a course of azithromycin, she is doing a lot better now, lymphadenopathy is far improved, I would like repeat ultrasound for additional measurements and we will keep an eye on things for now. PCP can take over.

## 2022-12-11 ENCOUNTER — Ambulatory Visit (INDEPENDENT_AMBULATORY_CARE_PROVIDER_SITE_OTHER): Payer: BC Managed Care – PPO

## 2022-12-11 DIAGNOSIS — R590 Localized enlarged lymph nodes: Secondary | ICD-10-CM

## 2022-12-17 ENCOUNTER — Encounter: Payer: Self-pay | Admitting: Medical-Surgical

## 2022-12-17 ENCOUNTER — Ambulatory Visit (INDEPENDENT_AMBULATORY_CARE_PROVIDER_SITE_OTHER): Payer: BC Managed Care – PPO | Admitting: Medical-Surgical

## 2022-12-17 VITALS — BP 121/75 | HR 80 | Resp 20 | Ht 66.0 in | Wt 181.7 lb

## 2022-12-17 DIAGNOSIS — F900 Attention-deficit hyperactivity disorder, predominantly inattentive type: Secondary | ICD-10-CM | POA: Diagnosis not present

## 2022-12-17 NOTE — Progress Notes (Signed)
   Established Patient Office Visit  Subjective   Patient ID: Tina Stephens, female   DOB: 01/31/1986 Age: 37 y.o. MRN: [id number]   Chief Complaint  Patient presents with   ADHD   Follow-up   HPI Pleasant 37 year old female presenting today for ADHD follow-up.  Reports that she stopped taking Adderall on 11/29/2022.  She notes that things in her life have come down tremendously and she has been looking to do a bit of a reset for a while.  She stopped taking the medication and has been doing well since.  No severe symptoms that impede her daily activities.  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  Plans to take some time away to relax and "detox from the world".  Would like to evaluate her symptoms to see if they are bad enough that she needs medications or if they are now manageable at this stage in her life.   Objective:    Vitals:   12/17/22 1031  BP: 121/75  Pulse: 80  Resp: 20  Height: 5\' 6"  (1.676 m)  Weight: 181 lb 11.2 oz (82.4 kg)  SpO2: 100%  BMI (Calculated): 29.34    Physical Exam Vitals and nursing note reviewed.  Constitutional:      General: She is not in acute distress.    Appearance: Normal appearance. She is not ill-appearing.  HENT:     Head: Normocephalic and atraumatic.  Cardiovascular:     Rate and Rhythm: Normal rate and regular rhythm.     Pulses: Normal pulses.     Heart sounds: Normal heart sounds.  Pulmonary:     Effort: Pulmonary effort is normal. No respiratory distress.     Breath sounds: Normal breath sounds. No wheezing, rhonchi or rales.  Skin:    General: Skin is warm and dry.  Neurological:     Mental Status: She is alert and oriented to person, place, and time.  Psychiatric:        Mood and Affect: Mood normal.        Behavior: Behavior normal.        Thought Content: Thought content normal.        Judgment:  Judgment normal.   No results found for this or any previous visit (from the past 24 hour(s)).     The ASCVD Risk score (Arnett DK, et al., 2019) failed to calculate for the following reasons:   The 2019 ASCVD risk score is only valid for ages 76 to 57   Assessment & Plan:   1. Attention deficit hyperactivity disorder (ADHD), predominantly inattentive type Discontinue Adderall for now.  Advised to monitor symptoms and if she feels that she does need medication, she should reach out to me so that we can review options of restarting Adderall or trying something different.  Patient verbalized understanding and is agreeable to the plan.  Return for annual physical exam at your convenience.  ___________________________________________ Clearnce Sorrel, DNP, APRN, FNP-BC Primary Care and [place]

## 2022-12-30 DIAGNOSIS — Z1151 Encounter for screening for human papillomavirus (HPV): Secondary | ICD-10-CM | POA: Diagnosis not present

## 2022-12-30 DIAGNOSIS — Z01419 Encounter for gynecological examination (general) (routine) without abnormal findings: Secondary | ICD-10-CM | POA: Diagnosis not present

## 2023-07-21 ENCOUNTER — Encounter: Payer: Self-pay | Admitting: Medical-Surgical

## 2023-07-21 ENCOUNTER — Telehealth (INDEPENDENT_AMBULATORY_CARE_PROVIDER_SITE_OTHER): Payer: BC Managed Care – PPO | Admitting: Medical-Surgical

## 2023-07-21 DIAGNOSIS — F900 Attention-deficit hyperactivity disorder, predominantly inattentive type: Secondary | ICD-10-CM

## 2023-07-21 NOTE — Progress Notes (Signed)
Virtual Visit via Video Note  I connected with Tina Stephens on 07/21/23 at  1:00 PM EDT by a video enabled telemedicine application and verified that I am speaking with the correct person using two identifiers.   I discussed the limitations of evaluation and management by telemedicine and the availability of in person appointments. The patient expressed understanding and agreed to proceed.  Patient location: home Provider locations: office  Subjective:    CC: Discuss ADHD options  HPI: Pleasant 37 year old female presents senting today via MyChart video visit to discuss ADHD.  She has been managing ADHD for quite a while and previously took Adderall.  She noted that the Adderall was losing effectiveness and not helping tremendously so she decided to discontinue the medication.  She has been off of ADHD meds since January of this year and feels that she has been doing okay for the most part.  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  Recently has found it hard to keep up with the workload and is falling behind with task completion.  Has difficulty focusing and getting stuff done.  Is interested in restarting ADHD medications but does not think that she wants to go back to Adderall.  Of note, we had tried switching to Vyvanse approximately 1 year ago however this did not have a generic at the time and her insurance would not cover it.  Has not taken any other ADHD medications outside of Adderall.   Past medical history, Surgical history, Family history not pertinant except as noted below, Social history, Allergies, and medications have been entered into the medical record, reviewed, and corrections made.   Review of Systems: See HPI for pertinent positives and negatives.   Objective:    General: Speaking clearly in complete sentences without any shortness of breath.  Alert and oriented x3.  Normal  judgment. No apparent acute distress.  Impression and Recommendations:    1. Attention deficit hyperactivity disorder (ADHD), predominantly inattentive type Discussed various options for treatment.  Generic Vyvanse is available now however back orders are posing supply issues.  As she would like to start with something different than Adderall, recommend starting Concerta as this is a little lower potency however may be more effective without the mood changes that she had noticed on Adderall.  Patient is agreeable so starting Concerta 36 mg daily.  Plan to follow-up in 4 weeks to evaluate tolerance and response to medication.  I discussed the assessment and treatment plan with the patient. The patient was provided an opportunity to ask questions and all were answered. The patient agreed with the plan and demonstrated an understanding of the instructions.   The patient was advised to call back or seek an in-person evaluation if the symptoms worsen or if the condition fails to improve as anticipated.  Return in about 4 weeks (around 08/18/2023) for ADHD follow up.  Thayer Ohm, DNP, APRN, FNP-BC [place] [facility] and [facility] Medicine

## 2023-07-22 ENCOUNTER — Encounter: Payer: Self-pay | Admitting: Medical-Surgical

## 2023-07-22 DIAGNOSIS — F900 Attention-deficit hyperactivity disorder, predominantly inattentive type: Secondary | ICD-10-CM

## 2023-07-23 MED ORDER — METHYLPHENIDATE HCL ER (OSM) 36 MG PO TBCR: 36.0000 mg | EXTENDED_RELEASE_TABLET | Freq: Every day | ORAL | 0 refills | Status: DC

## 2023-08-31 ENCOUNTER — Encounter: Payer: Self-pay | Admitting: Medical-Surgical

## 2023-08-31 ENCOUNTER — Telehealth (INDEPENDENT_AMBULATORY_CARE_PROVIDER_SITE_OTHER): Payer: BC Managed Care – PPO | Admitting: Medical-Surgical

## 2023-08-31 DIAGNOSIS — F900 Attention-deficit hyperactivity disorder, predominantly inattentive type: Secondary | ICD-10-CM | POA: Diagnosis not present

## 2023-08-31 MED ORDER — LISDEXAMFETAMINE DIMESYLATE 30 MG PO CAPS: 30.0000 mg | ORAL_CAPSULE | Freq: Every day | ORAL | 0 refills | Status: DC

## 2023-08-31 NOTE — Progress Notes (Signed)
Virtual Visit via Video Note  I connected with Tina Stephens on 08/31/23 at 10:30 AM EDT by a video enabled telemedicine application and verified that I am speaking with the correct person using two identifiers.   I discussed the limitations of evaluation and management by telemedicine and the availability of in person appointments. The patient expressed understanding and agreed to proceed.  Patient location: home Provider locations: office  Subjective:    CC: ADHD follow-up  HPI: Pleasant 37 year old female presenting via MyChart video visit for follow-up on ADHD.  At her last visit approximately 1 month ago, she was started on Concerta 36 mg daily as she wanted to try something lower potency and different from Adderall. Has been taking the Concerta daily but reports she has not had any effect (positive or negative) on her symptoms. No side effects noted.    Past medical history, Surgical history, Family history not pertinant except as noted below, Social history, Allergies, and medications have been entered into the medical record, reviewed, and corrections made.   Review of Systems: See HPI for pertinent positives and negatives.   Objective:    General: Speaking clearly in complete sentences without any shortness of breath.  Alert and oriented x3.  Normal judgment. No apparent acute distress.  Impression and Recommendations:    1. Attention deficit hyperactivity disorder (ADHD), predominantly inattentive type Previously treated with high dose Adderall so unsurprising that the dose of Concerta did not provide an effect. After discussion, switching the generic Vyvanse 30mg  daily for 1 month then plan to titrate up on the dose if needed. Plan to touch base via MyChart message in 3 weeks to evaluate response and make further plans.   I discussed the assessment and treatment plan with the patient. The patient was provided an opportunity to ask questions and all were answered. The patient  agreed with the plan and demonstrated an understanding of the instructions.   The patient was advised to call back or seek an in-person evaluation if the symptoms worsen or if the condition fails to improve as anticipated.  Return in about 3 weeks (around 09/21/2023) for ADHD follow up via MyChart message.  Thayer Ohm, DNP, APRN, FNP-BC [place] [facility] and [facility] Medicine

## 2023-09-21 ENCOUNTER — Encounter: Payer: Self-pay | Admitting: Medical-Surgical

## 2023-09-22 MED ORDER — LISDEXAMFETAMINE DIMESYLATE 30 MG PO CAPS: 30.0000 mg | ORAL_CAPSULE | Freq: Every day | ORAL | 0 refills | Status: DC

## 2023-10-01 MED ORDER — LISDEXAMFETAMINE DIMESYLATE 30 MG PO CAPS: 30.0000 mg | ORAL_CAPSULE | Freq: Every day | ORAL | 0 refills | Status: DC

## 2023-10-01 NOTE — Addendum Note (Signed)
Addended byChristen Butter on: 10/01/2023 12:34 PM   Modules accepted: Orders

## 2024-02-02 ENCOUNTER — Encounter: Payer: Self-pay | Admitting: Medical-Surgical

## 2024-02-02 ENCOUNTER — Telehealth (INDEPENDENT_AMBULATORY_CARE_PROVIDER_SITE_OTHER): Admitting: Medical-Surgical

## 2024-02-02 DIAGNOSIS — F900 Attention-deficit hyperactivity disorder, predominantly inattentive type: Secondary | ICD-10-CM

## 2024-02-02 MED ORDER — AMPHETAMINE-DEXTROAMPHET ER 15 MG PO CP24: 15.0000 mg | ORAL_CAPSULE | ORAL | 0 refills | Status: DC

## 2024-02-02 NOTE — Progress Notes (Signed)
 Virtual Visit via Video Note  I connected with Tina Stephens on 02/02/24 at  9:50 AM EDT by a video enabled telemedicine application and verified that I am speaking with the correct person using two identifiers.   I discussed the limitations of evaluation and management by telemedicine and the availability of in person appointments. The patient expressed understanding and agreed to proceed.  Patient location: home Provider locations: office  Subjective:    CC: ADHD follow-up  HPI: Pleasant 38 year old female presenting today via MyChart video visit for ADHD follow-up.  Previously took Vyvanse however this was unhelpful for ADHD and did not help with her appetite.  She has been off this several months and has been trying to get through the days without medicating.  Unfortunately, she notes her focus has been very difficult to maintain and she has gained quite a bit of weight.  Is now approximately 200 pounds.  This is quite bothersome and she is looking for options to help with this.  Previously took Adderall which helped to keep her appetite suppressed and helped her with weight loss.  Maintained a weight of 165 pounds for over a year and was very happy at this weight.  Interested in options to help with both at this time.  Past medical history, Surgical history, Family history not pertinant except as noted below, Social history, Allergies, and medications have been entered into the medical record, reviewed, and corrections made.   Review of Systems: See HPI for pertinent positives and negatives.   Objective:    General: Speaking clearly in complete sentences without any shortness of breath.  Alert and oriented x3.  Normal judgment. No apparent acute distress.  Impression and Recommendations:    1. Attention deficit hyperactivity disorder (ADHD), predominantly inattentive type (Primary) After discussion, plan to restart Adderall XR.  She was previously on 30 mg daily which maxed out her  options and is hesitant to consider going back on such a high dose.  We will plan to restart her on Adderall XR 15 mg daily for the next month and have her follow-up in office since it has been greater than 1 year since she has been seen.  Patient is agreeable to this and will call the front desk to schedule.  I discussed the assessment and treatment plan with the patient. The patient was provided an opportunity to ask questions and all were answered. The patient agreed with the plan and demonstrated an understanding of the instructions.   The patient was advised to call back or seek an in-person evaluation if the symptoms worsen or if the condition fails to improve as anticipated.  Return in about 4 weeks (around 03/01/2024) for ADHD follow up.  Thayer Ohm, DNP, APRN, FNP-BC [place] [facility] and [facility] Medicine

## 2024-02-05 ENCOUNTER — Telehealth: Admitting: Medical-Surgical

## 2024-02-08 ENCOUNTER — Telehealth: Admitting: Medical-Surgical

## 2024-02-29 ENCOUNTER — Ambulatory Visit (INDEPENDENT_AMBULATORY_CARE_PROVIDER_SITE_OTHER): Admitting: Medical-Surgical

## 2024-02-29 VITALS — BP 120/75 | HR 63 | Resp 20 | Ht 66.0 in | Wt 196.1 lb

## 2024-02-29 DIAGNOSIS — Z0000 Encounter for general adult medical examination without abnormal findings: Secondary | ICD-10-CM

## 2024-02-29 DIAGNOSIS — Z13220 Encounter for screening for lipoid disorders: Secondary | ICD-10-CM | POA: Diagnosis not present

## 2024-02-29 DIAGNOSIS — F900 Attention-deficit hyperactivity disorder, predominantly inattentive type: Secondary | ICD-10-CM | POA: Diagnosis not present

## 2024-02-29 MED ORDER — AMPHETAMINE-DEXTROAMPHET ER 15 MG PO CP24: 15.0000 mg | ORAL_CAPSULE | ORAL | 0 refills | Status: DC

## 2024-02-29 NOTE — Patient Instructions (Signed)

## 2024-02-29 NOTE — Progress Notes (Signed)
 Complete physical exam  Patient: Tina Stephens   DOB: [birth year]   38 y.o. Female  MRN: [id number]  Subjective:    Chief Complaint  Patient presents with   Annual Exam    Camdyn Laden is a 38 y.o. female who presents today for a complete physical exam. She reports consuming a general diet. The patient has a physically strenuous job, but has no regular exercise apart from work.  She generally feels well. She reports sleeping well. She does not have additional problems to discuss today.    Most recent fall risk assessment:    12/17/2022   10:32 AM  Fall Risk   Falls in the past year? 0  Number falls in past yr: 0  Injury with Fall? 0  Risk for fall due to : No Fall Risks  Follow up Falls evaluation completed     Most recent depression screenings:    02/02/2024   10:08 AM 12/17/2022   10:33 AM  PHQ 2/9 Scores  PHQ - 2 Score 0 0    Vision:Within last year, Dental: No current dental problems and Receives regular dental care, and STD: The patient denies history of sexually transmitted disease.    Patient Care Team: Cherre Cornish, NP as PCP - General (Nurse Practitioner)   Outpatient Medications Prior to Visit  Medication Sig   Norgestimate-Ethinyl Estradiol Triphasic 0.18/0.215/0.25 MG-25 MCG tab Take by mouth.   [DISCONTINUED] amphetamine -dextroamphetamine (ADDERALL XR) 15 MG 24 hr capsule Take 1 capsule by mouth every morning.   No facility-administered medications prior to visit.    Review of Systems  Constitutional:  Negative for chills, fever, malaise/fatigue and weight loss.  HENT:  Negative for congestion, ear pain, hearing loss, sinus pain and sore throat.   Eyes:  Negative for blurred vision, photophobia and pain.  Respiratory:  Negative for cough, shortness of breath and wheezing.   Cardiovascular:  Negative for chest pain, palpitations and leg swelling.  Gastrointestinal:  Negative for abdominal pain, constipation, diarrhea, heartburn, nausea and vomiting.   Genitourinary:  Negative for dysuria, frequency and urgency.  Musculoskeletal:  Negative for falls and neck pain.  Skin:  Negative for itching and rash.  Neurological:  Positive for headaches. Negative for dizziness and weakness.  Endo/Heme/Allergies:  Negative for polydipsia. Does not bruise/bleed easily.  Psychiatric/Behavioral:  Negative for depression, substance abuse and suicidal ideas. The patient is not nervous/anxious.      Objective:    BP 120/75 (BP Location: Right Arm, Cuff Size: Normal)   Pulse 63   Resp 20   Ht 5\' 6"  (1.676 m)   Wt 196 lb 1.9 oz (89 kg)   SpO2 91%   BMI 31.65 kg/m    Physical Exam Constitutional:      General: She is not in acute distress.    Appearance: Normal appearance. She is not ill-appearing.  HENT:     Head: Normocephalic and atraumatic.     Right Ear: Tympanic membrane, ear canal and external ear normal. There is no impacted cerumen.     Left Ear: Tympanic membrane, ear canal and external ear normal. There is no impacted cerumen.     Nose: Nose normal. No congestion or rhinorrhea.     Mouth/Throat:     Mouth: Mucous membranes are moist.     Pharynx: No oropharyngeal exudate or posterior oropharyngeal erythema.  Eyes:     General: No scleral icterus.       Right eye: No discharge.  Left eye: No discharge.     Extraocular Movements: Extraocular movements intact.     Conjunctiva/sclera: Conjunctivae normal.     Pupils: Pupils are equal, round, and reactive to light.  Neck:     Thyroid : No thyromegaly.     Vascular: No carotid bruit or JVD.     Trachea: Trachea normal.  Cardiovascular:     Rate and Rhythm: Normal rate and regular rhythm.     Pulses: Normal pulses.     Heart sounds: Normal heart sounds. No murmur heard.    No friction rub. No gallop.  Pulmonary:     Effort: Pulmonary effort is normal. No respiratory distress.     Breath sounds: Normal breath sounds. No wheezing.  Abdominal:     General: Bowel sounds are  normal. There is no distension.     Palpations: Abdomen is soft.     Tenderness: There is no abdominal tenderness. There is no guarding.  Musculoskeletal:        General: Normal range of motion.     Cervical back: Normal range of motion and neck supple.  Lymphadenopathy:     Cervical: No cervical adenopathy.  Skin:    General: Skin is warm and dry.  Neurological:     Mental Status: She is alert and oriented to person, place, and time.     Cranial Nerves: No cranial nerve deficit.  Psychiatric:        Mood and Affect: Mood normal.        Behavior: Behavior normal.        Thought Content: Thought content normal.        Judgment: Judgment normal.   No results found for any visits on 02/29/24.     Assessment & Plan:    Routine Health Maintenance and Physical Exam  Immunization History  Administered Date(s) Administered   Tdap 08/29/2021    Health Maintenance  Topic Date Due   Hepatitis C Screening  02/28/2025 (Originally 06/20/2002)   HIV Screening  02/28/2025 (Originally 06/21/1999)   INFLUENZA VACCINE  06/10/2024   Cervical Cancer Screening (HPV/Pap Cotest)  12/31/2027   DTaP/Tdap/Td (2 - Td or Tdap) 08/30/2031   HPV VACCINES  Aged Out   Meningococcal B Vaccine  Aged Out   COVID-19 Vaccine  Discontinued    Discussed health benefits of physical activity, and encouraged her to engage in regular exercise appropriate for her age and condition.  1. Annual physical exam (Primary) Checking labs as below.  Up-to-date on preventative care.  Wellness information provided with AVS. - CBC with Differential/Platelet - CMP14+EGFR - Lipid panel  2. Attention deficit hyperactivity disorder (ADHD), predominantly inattentive type Stable and doing well overall.  Continue Adderall XR 15 mg daily.  She does have some irritability and we discussed increasing her dose however she would like to continue 15 mg for now. - amphetamine -dextroamphetamine (ADDERALL XR) 15 MG 24 hr capsule; Take 1  capsule by mouth every morning.  Dispense: 30 capsule; Refill: 0 - amphetamine -dextroamphetamine (ADDERALL XR) 15 MG 24 hr capsule; Take 1 capsule by mouth every morning.  Dispense: 30 capsule; Refill: 0 - amphetamine -dextroamphetamine (ADDERALL XR) 15 MG 24 hr capsule; Take 1 capsule by mouth every morning.  Dispense: 30 capsule; Refill: 0  3. Lipid screening Checking lipids today. - Lipid panel  Return in about 6 months (around 08/30/2024) for ADHD follow up.   Domenica Weightman, NP

## 2024-03-01 ENCOUNTER — Encounter: Payer: Self-pay | Admitting: Medical-Surgical

## 2024-03-01 LAB — LIPID PANEL
Chol/HDL Ratio: 3.1 ratio (ref 0.0–4.4)
Cholesterol, Total: 193 mg/dL (ref 100–199)
HDL: 63 mg/dL (ref >39)
LDL Chol Calc (NIH): 115 mg/dL — ABNORMAL HIGH (ref 0–99)
Triglycerides: 84 mg/dL (ref 0–149)
VLDL Cholesterol Cal: 15 mg/dL (ref 5–40)

## 2024-03-01 LAB — CBC WITH DIFFERENTIAL/PLATELET
Basophils Absolute: 0 10*3/uL (ref 0.0–0.2)
Basos: 1 %
EOS (ABSOLUTE): 0 10*3/uL (ref 0.0–0.4)
Eos: 1 %
Hematocrit: 38.2 % (ref 34.0–46.6)
Hemoglobin: 12.2 g/dL (ref 11.1–15.9)
Immature Grans (Abs): 0 10*3/uL (ref 0.0–0.1)
Immature Granulocytes: 0 %
Lymphocytes Absolute: 2.5 10*3/uL (ref 0.7–3.1)
Lymphs: 35 %
MCH: 28 pg (ref 26.6–33.0)
MCHC: 31.9 g/dL (ref 31.5–35.7)
MCV: 88 fL (ref 79–97)
Monocytes Absolute: 0.4 10*3/uL (ref 0.1–0.9)
Monocytes: 6 %
Neutrophils Absolute: 4.2 10*3/uL (ref 1.4–7.0)
Neutrophils: 57 %
Platelets: 201 10*3/uL (ref 150–450)
RBC: 4.36 x10E6/uL (ref 3.77–5.28)
RDW: 12 % (ref 11.7–15.4)
WBC: 7.3 10*3/uL (ref 3.4–10.8)

## 2024-03-01 LAB — CMP14+EGFR
ALT: 11 IU/L (ref 0–32)
AST: 16 IU/L (ref 0–40)
Albumin: 4.1 g/dL (ref 3.9–4.9)
Alkaline Phosphatase: 88 IU/L (ref 44–121)
BUN/Creatinine Ratio: 21 (ref 9–23)
BUN: 16 mg/dL (ref 6–20)
Bilirubin Total: 0.3 mg/dL (ref 0.0–1.2)
CO2: 22 mmol/L (ref 20–29)
Calcium: 9.1 mg/dL (ref 8.7–10.2)
Chloride: 104 mmol/L (ref 96–106)
Creatinine, Ser: 0.75 mg/dL (ref 0.57–1.00)
Globulin, Total: 2.6 g/dL (ref 1.5–4.5)
Glucose: 79 mg/dL (ref 70–99)
Potassium: 4.2 mmol/L (ref 3.5–5.2)
Sodium: 139 mmol/L (ref 134–144)
Total Protein: 6.7 g/dL (ref 6.0–8.5)
eGFR: 104 mL/min/{1.73_m2} (ref >59)

## 2024-03-04 ENCOUNTER — Encounter: Admitting: Medical-Surgical

## 2024-03-11 DIAGNOSIS — R87610 Atypical squamous cells of undetermined significance on cytologic smear of cervix (ASC-US): Secondary | ICD-10-CM | POA: Diagnosis not present

## 2024-03-11 DIAGNOSIS — Z1151 Encounter for screening for human papillomavirus (HPV): Secondary | ICD-10-CM | POA: Diagnosis not present

## 2024-03-11 DIAGNOSIS — Z01419 Encounter for gynecological examination (general) (routine) without abnormal findings: Secondary | ICD-10-CM | POA: Diagnosis not present

## 2024-03-11 DIAGNOSIS — Z793 Long term (current) use of hormonal contraceptives: Secondary | ICD-10-CM | POA: Diagnosis not present

## 2024-03-11 DIAGNOSIS — Z124 Encounter for screening for malignant neoplasm of cervix: Secondary | ICD-10-CM | POA: Diagnosis not present

## 2024-06-27 ENCOUNTER — Encounter: Payer: Self-pay | Admitting: Medical-Surgical

## 2024-06-27 DIAGNOSIS — F900 Attention-deficit hyperactivity disorder, predominantly inattentive type: Secondary | ICD-10-CM

## 2024-06-28 MED ORDER — AMPHETAMINE-DEXTROAMPHET ER 15 MG PO CP24: 15.0000 mg | ORAL_CAPSULE | ORAL | 0 refills | Status: DC

## 2024-06-28 MED ORDER — AMPHETAMINE-DEXTROAMPHET ER 15 MG PO CP24
15.0000 mg | ORAL_CAPSULE | ORAL | 0 refills | Status: DC
Start: 2024-07-28 — End: 2024-08-30

## 2024-07-12 ENCOUNTER — Encounter: Payer: Self-pay | Admitting: Sports Medicine

## 2024-08-30 ENCOUNTER — Other Ambulatory Visit: Payer: Self-pay | Admitting: Medical Genetics

## 2024-08-30 ENCOUNTER — Ambulatory Visit (INDEPENDENT_AMBULATORY_CARE_PROVIDER_SITE_OTHER): Admitting: Medical-Surgical

## 2024-08-30 ENCOUNTER — Encounter: Payer: Self-pay | Admitting: Medical-Surgical

## 2024-08-30 VITALS — BP 122/77 | HR 60 | Resp 20 | Ht 66.0 in | Wt 192.0 lb

## 2024-08-30 DIAGNOSIS — Z1231 Encounter for screening mammogram for malignant neoplasm of breast: Secondary | ICD-10-CM

## 2024-08-30 DIAGNOSIS — F900 Attention-deficit hyperactivity disorder, predominantly inattentive type: Secondary | ICD-10-CM

## 2024-08-30 MED ORDER — BUPROPION HCL ER (XL) 150 MG PO TB24: 150.0000 mg | ORAL_TABLET | Freq: Every day | ORAL | 1 refills | Status: AC

## 2024-08-30 MED ORDER — AMPHETAMINE-DEXTROAMPHET ER 15 MG PO CP24: 15.0000 mg | ORAL_CAPSULE | ORAL | 0 refills | Status: AC

## 2024-08-30 MED ORDER — AMPHETAMINE-DEXTROAMPHET ER 15 MG PO CP24: 15.0000 mg | ORAL_CAPSULE | ORAL | 0 refills | Status: DC

## 2024-08-30 NOTE — Assessment & Plan Note (Signed)
 Adderall XR 15 mg effective but causes irritability.  Hesitant to consider increasing Adderall dose.  Wellbutrin added to manage irritability and improve focus without increasing Adderall. - Continue Adderall XR 15 mg daily. - Add Wellbutrin XL 150 mg daily - Follow-up in four weeks to assess effectiveness.

## 2024-08-30 NOTE — Progress Notes (Signed)
        Established patient visit   History of Present Illness   Discussed the use of AI scribe software for clinical note transcription with the patient, who gave verbal consent to proceed.  History of Present Illness   Tina Stephens is a 38 year old female with ADHD who presents for a follow-up visit.  Attention deficit and hyperactivity symptoms - Difficulty with focus and productivity - Utilizes time blocking and turning off notifications to manage symptoms - Currently taking Adderall XR 15 mg daily - Does not notice significant therapeutic effects from current dose, but perceives a difference when not taking it - Increased irritability while on medication - Mood described as 'really crappy lately' - Previously on Adderall 30 mg, then off medication for over a year before restarting at current dose - Prior trials of Vyvanse  and Concerta  without significant benefit     Physical Exam   Physical Exam Vitals reviewed.  Constitutional:      General: She is not in acute distress.    Appearance: Normal appearance.  HENT:     Head: Normocephalic and atraumatic.  Cardiovascular:     Rate and Rhythm: Normal rate and regular rhythm.  Pulmonary:     Effort: Pulmonary effort is normal. No respiratory distress.  Skin:    General: Skin is warm and dry.  Neurological:     Mental Status: She is alert and oriented to person, place, and time.  Psychiatric:        Mood and Affect: Mood normal.        Behavior: Behavior normal.        Thought Content: Thought content normal.        Judgment: Judgment normal.    Assessment & Plan   Problem List Items Addressed This Visit       Other   Attention deficit hyperactivity disorder (ADHD), predominantly inattentive type - Primary   Adderall XR 15 mg effective but causes irritability.  Hesitant to consider increasing Adderall dose.  Wellbutrin added to manage irritability and improve focus without increasing Adderall. - Continue Adderall  XR 15 mg daily. - Add Wellbutrin XL 150 mg daily - Follow-up in four weeks to assess effectiveness.      Relevant Medications   buPROPion (WELLBUTRIN XL) 150 MG 24 hr tablet   amphetamine -dextroamphetamine (ADDERALL XR) 15 MG 24 hr capsule (Start on 10/29/2024)   amphetamine -dextroamphetamine (ADDERALL XR) 15 MG 24 hr capsule (Start on 09/29/2024)   amphetamine -dextroamphetamine (ADDERALL XR) 15 MG 24 hr capsule   Other Visit Diagnoses       Encounter for screening mammogram for malignant neoplasm of breast       Relevant Orders   MM DIGITAL SCREENING BILATERAL      Breast cancer screening - Ordering mammogram today.  Follow up   Return in about 4 weeks (around 09/27/2024) for ADHD follow up. __________________________________ Zada FREDRIK Palin, DNP, APRN, FNP-BC Primary [facility] and Sports Medicine [facility] [place]

## 2024-09-01 ENCOUNTER — Telehealth (HOSPITAL_BASED_OUTPATIENT_CLINIC_OR_DEPARTMENT_OTHER): Payer: Self-pay

## 2024-09-24 ENCOUNTER — Ambulatory Visit (HOSPITAL_BASED_OUTPATIENT_CLINIC_OR_DEPARTMENT_OTHER)
Admission: RE | Admit: 2024-09-24 | Discharge: 2024-09-24 | Disposition: A | Discharge status: Home / Self Care | Source: Ambulatory Visit | Attending: Medical-Surgical | Admitting: Medical-Surgical

## 2024-09-24 DIAGNOSIS — Z1231 Encounter for screening mammogram for malignant neoplasm of breast: Secondary | ICD-10-CM | POA: Diagnosis not present

## 2024-09-24 LAB — HM MAMMOGRAPHY

## 2024-09-27 ENCOUNTER — Ambulatory Visit (INDEPENDENT_AMBULATORY_CARE_PROVIDER_SITE_OTHER): Admitting: Medical-Surgical

## 2024-09-27 ENCOUNTER — Encounter: Payer: Self-pay | Admitting: Medical-Surgical

## 2024-09-27 ENCOUNTER — Ambulatory Visit: Payer: Self-pay | Admitting: Medical-Surgical

## 2024-09-27 VITALS — BP 126/81 | HR 81 | Resp 20 | Ht 66.0 in | Wt 190.0 lb

## 2024-09-27 DIAGNOSIS — F900 Attention-deficit hyperactivity disorder, predominantly inattentive type: Secondary | ICD-10-CM | POA: Diagnosis not present

## 2024-09-27 MED ORDER — AMPHETAMINE-DEXTROAMPHET ER 15 MG PO CP24: 15.0000 mg | ORAL_CAPSULE | ORAL | 0 refills | Status: AC

## 2024-09-27 NOTE — Progress Notes (Signed)
 Medical screening examination/treatment was performed by qualified clinical staff member and as supervising provider I was immediately available for consultation/collaboration. I have reviewed documentation and agree with assessment and plan.  Thayer Ohm, DNP, APRN, FNP-BC [place] [facility] Medicine

## 2024-09-27 NOTE — Progress Notes (Signed)
   Established Patient Office Visit  Subjective   Patient ID: Tina Stephens, female    DOB: 1985-12-29  Age: 38 y.o. MRN: [id number]  Chief Complaint  Patient presents with   ADHD    Follow up    HPI  38 year old female presents for a 6 month follow for her ADHD. She reports doing well on her current regimen of Adderall XR 15 mg and Wellbutrin 150 mg daily. Wellbutrin 150 mg was recently added to her regimen 30 days ago. She denies having any side effects to her medications. She would like to remain at her current dose.   Review of Systems  Constitutional: Negative.   HENT: Negative.    Eyes: Negative.   Respiratory: Negative.    Cardiovascular: Negative.   Gastrointestinal: Negative.   Genitourinary: Negative.   Musculoskeletal: Negative.   Skin: Negative.   Neurological: Negative.   Endo/Heme/Allergies: Negative.   Psychiatric/Behavioral: Negative.        Objective:     BP 126/81 (BP Location: Left Arm, Cuff Size: Normal)   Pulse 81   Resp 20   Ht 5' 6 (1.676 m)   Wt 86.2 kg   SpO2 100%   BMI 30.67 kg/m  BP Readings from Last 3 Encounters:  09/27/24 126/81  08/30/24 122/77  02/29/24 120/75   Wt Readings from Last 3 Encounters:  09/27/24 86.2 kg  08/30/24 87.1 kg  02/29/24 89 kg      Physical Exam Vitals and nursing note reviewed.  Constitutional:      General: She is not in acute distress.    Appearance: Normal appearance.  Cardiovascular:     Rate and Rhythm: Normal rate and regular rhythm.     Pulses: Normal pulses.     Heart sounds: Normal heart sounds.  Pulmonary:     Effort: Pulmonary effort is normal.     Breath sounds: Normal breath sounds.  Neurological:     General: No focal deficit present.     Mental Status: She is alert and oriented to person, place, and time.  Psychiatric:        Mood and Affect: Mood normal.        Behavior: Behavior normal.        Thought Content: Thought content normal.        Judgment: Judgment normal.       Results for orders placed or performed in visit on 09/27/24  HM MAMMOGRAPHY  Result Value Ref Range   HM Mammogram 0-4 Bi-Rad 0-4 Bi-Rad, Self Reported Normal      The 10-year ASCVD risk score (Arnett DK, et al., 2019) is: 0.4%    Assessment & Plan:   1. Attention deficit hyperactivity disorder (ADHD), predominantly inattentive type (Primary) -Continue with current regimen of Adderall XR 15 mg daily and Wellbutrin 150 mg daily -Follow up in 6 months     Derrek JINNY Freund, NP Student

## 2024-09-28 ENCOUNTER — Other Ambulatory Visit: Payer: Self-pay | Admitting: Medical-Surgical

## 2024-09-28 DIAGNOSIS — R928 Other abnormal and inconclusive findings on diagnostic imaging of breast: Secondary | ICD-10-CM

## 2024-10-13 ENCOUNTER — Ambulatory Visit
Admission: RE | Admit: 2024-10-13 | Discharge: 2024-10-13 | Disposition: A | Source: Ambulatory Visit | Attending: Medical-Surgical

## 2024-10-13 ENCOUNTER — Ambulatory Visit: Payer: Self-pay | Admitting: Medical-Surgical

## 2024-10-13 DIAGNOSIS — R928 Other abnormal and inconclusive findings on diagnostic imaging of breast: Secondary | ICD-10-CM

## 2024-10-13 DIAGNOSIS — N6489 Other specified disorders of breast: Secondary | ICD-10-CM | POA: Diagnosis not present

## 2025-01-11 ENCOUNTER — Other Ambulatory Visit (HOSPITAL_COMMUNITY)

## 2025-03-27 ENCOUNTER — Ambulatory Visit: Admitting: Medical-Surgical
